# Patient Record
Sex: Female | Born: 1995 | Race: Black or African American | Hispanic: No | Marital: Single | Smoking: Never smoker
Health system: Southern US, Community
[De-identification: ages and names within clinical notes are randomized; demographics above are authoritative.]

## PROBLEM LIST (undated history)

## (undated) DIAGNOSIS — J45909 Unspecified asthma, uncomplicated: Secondary | ICD-10-CM

---
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
## 2015-05-05 ENCOUNTER — Encounter (HOSPITAL_COMMUNITY): Payer: Self-pay | Admitting: Emergency Medicine

## 2015-05-05 ENCOUNTER — Emergency Department (INDEPENDENT_AMBULATORY_CARE_PROVIDER_SITE_OTHER)
Admission: EM | Admit: 2015-05-05 | Discharge: 2015-05-05 | Disposition: A | Discharge status: Home / Self Care | Payer: PRIVATE HEALTH INSURANCE | Source: Home / Self Care | Attending: Family Medicine | Admitting: Family Medicine

## 2015-05-05 DIAGNOSIS — K5901 Slow transit constipation: Secondary | ICD-10-CM

## 2015-05-05 DIAGNOSIS — N939 Abnormal uterine and vaginal bleeding, unspecified: Secondary | ICD-10-CM | POA: Diagnosis not present

## 2015-05-05 LAB — POCT I-STAT, CHEM 8
BUN: 12 mg/dL (ref 6–20)
CHLORIDE: 108 mmol/L (ref 101–111)
CREATININE: 0.7 mg/dL (ref 0.44–1.00)
Calcium, Ion: 1.25 mmol/L — ABNORMAL HIGH (ref 1.12–1.23)
Glucose, Bld: 81 mg/dL (ref 65–99)
HCT: 38 % (ref 36.0–46.0)
Hemoglobin: 12.9 g/dL (ref 12.0–15.0)
Potassium: 4.1 mmol/L (ref 3.5–5.1)
Sodium: 141 mmol/L (ref 135–145)
TCO2: 23 mmol/L (ref 0–100)

## 2015-05-05 LAB — POCT PREGNANCY, URINE: Preg Test, Ur: NEGATIVE

## 2015-05-05 MED ORDER — MEDROXYPROGESTERONE ACETATE 5 MG PO TABS: ORAL_TABLET | ORAL | Status: AC

## 2015-05-05 NOTE — ED Notes (Signed)
Reports 4 weeks of irregular periods.  Reports sequence may be 5 days on, 2 days off, 5 days on.  Patient reveals a history of menstrual cycle issues.  Reports having a specialist in [place] that is working with her on this issue and birth control options

## 2015-05-05 NOTE — Discharge Instructions (Signed)
Abnormal Uterine Bleeding Abnormal uterine bleeding can affect women at various stages in life, including teenagers, women in their reproductive years, pregnant women, and women who have reached menopause. Several kinds of uterine bleeding are considered abnormal, including:  Bleeding or spotting between periods.   Bleeding after sexual intercourse.   Bleeding that is heavier or more than normal.   Periods that last longer than usual.  Bleeding after menopause.  Many cases of abnormal uterine bleeding are minor and simple to treat, while others are more serious. Any type of abnormal bleeding should be evaluated by your health care provider. Treatment will depend on the cause of the bleeding. HOME CARE INSTRUCTIONS Monitor your condition for any changes. The following actions may help to alleviate any discomfort you are experiencing:  Avoid the use of tampons and douches as directed by your health care provider.  Change your pads frequently. You should get regular pelvic exams and Pap tests. Keep all follow-up appointments for diagnostic tests as directed by your health care provider.  SEEK MEDICAL CARE IF:   Your bleeding lasts more than 1 week.   You feel dizzy at times.  SEEK IMMEDIATE MEDICAL CARE IF:   You pass out.   You are changing pads every 15 to 30 minutes.   You have abdominal pain.  You have a fever.   You become sweaty or weak.   You are passing large blood clots from the vagina.   You start to feel nauseous and vomit. MAKE SURE YOU:   Understand these instructions.  Will watch your condition.  Will get help right away if you are not doing well or get worse.   This information is not intended to replace advice given to you by your health care provider. Make sure you discuss any questions you have with your health care provider.   Document Released: 06/21/2005 Document Revised: 06/26/2013 Document Reviewed: 01/18/2013 Elsevier Interactive  Patient Education 2016 [employer].  Constipation, Adult Recommend MiraLAX. Place 1capfull in 6-8 ounces of water or other liquid to have a BM. If needed repeat daily until you have a good BM. Drink plenty of fluids, increase fiber in your diet. Constipation is when a person has fewer than three bowel movements a week, has difficulty having a bowel movement, or has stools that are dry, hard, or larger than normal. As people grow older, constipation is more common. A low-fiber diet, not taking in enough fluids, and taking certain medicines may make constipation worse.  CAUSES   Certain medicines, such as antidepressants, pain medicine, iron supplements, antacids, and water pills.   Certain diseases, such as diabetes, irritable bowel syndrome (IBS), thyroid disease, or depression.   Not drinking enough water.   Not eating enough fiber-rich foods.   Stress or travel.   Lack of physical activity or exercise.   Ignoring the urge to have a bowel movement.   Using laxatives too much.  SIGNS AND SYMPTOMS   Having fewer than three bowel movements a week.   Straining to have a bowel movement.   Having stools that are hard, dry, or larger than normal.   Feeling full or bloated.   Pain in the lower abdomen.   Not feeling relief after having a bowel movement.  DIAGNOSIS  Your health care provider will take a medical history and perform a physical exam. Further testing may be done for severe constipation. Some tests may include:  A barium enema X-ray to examine your rectum, colon, and, sometimes, your  small intestine.   A sigmoidoscopy to examine your lower colon.   A colonoscopy to examine your entire colon. TREATMENT  Treatment will depend on the severity of your constipation and what is causing it. Some dietary treatments include drinking more fluids and eating more fiber-rich foods. Lifestyle treatments may include regular exercise. If these diet and lifestyle  recommendations do not help, your health care provider may recommend taking over-the-counter laxative medicines to help you have bowel movements. Prescription medicines may be prescribed if over-the-counter medicines do not work.  HOME CARE INSTRUCTIONS   Eat foods that have a lot of fiber, such as fruits, vegetables, whole grains, and beans.  Limit foods high in fat and processed sugars, such as french fries, hamburgers, cookies, candies, and soda.   A fiber supplement may be added to your diet if you cannot get enough fiber from foods.   Drink enough fluids to keep your urine clear or pale yellow.   Exercise regularly or as directed by your health care provider.   Go to the restroom when you have the urge to go. Do not hold it.   Only take over-the-counter or prescription medicines as directed by your health care provider. Do not take other medicines for constipation without talking to your health care provider first.  SEEK IMMEDIATE MEDICAL CARE IF:   You have bright red blood in your stool.   Your constipation lasts for more than 4 days or gets worse.   You have abdominal or rectal pain.   You have thin, pencil-like stools.   You have unexplained weight loss. MAKE SURE YOU:   Understand these instructions.  Will watch your condition.  Will get help right away if you are not doing well or get worse.   This information is not intended to replace advice given to you by your health care provider. Make sure you discuss any questions you have with your health care provider.   Document Released: 03/19/2004 Document Revised: 07/12/2014 Document Reviewed: 04/02/2013 Elsevier Interactive Patient Education [employer] Elsevier Inc.

## 2015-05-05 NOTE — ED Provider Notes (Signed)
CSN: [id number]     Arrival date & time 05/05/15  1344 History   First MD Initiated Contact with Patient 05/05/15 1529     Chief Complaint  Patient presents with  . Vaginal Bleeding   (Consider location/radiation/quality/duration/timing/severity/associated sxs/prior Treatment) HPI Comments: 19 year old female complaining of irregular menstrual flow for proximally 4 weeks. She states that she will have a flow for 2-4 days at a time and then stopped for 2-3 days before it restarts. She states she uses approximately one and a half pads per hour. She has been having menstrual irregularities for several months and has a physician in [place] who is treating her for this problem. She has been treated with OCPs and hormonal patches. She has been cycling these over a period of time. She finally removed the patch approximately 2 weeks ago and she is currently not taking any type of hormonal birth control. She decided to call her today and she had no appointments today so she was sent to the urgent care. She states she is feeling a little lightheaded and having some abdominal cramping. She states the cramping is in the lower abdomen.  She points to the mid abdomen as the source of cramping. She does not have regular bowel movements. Her last normal BM was around 4 days ago.   Past Medical History  Diagnosis Date  . Asthma    History reviewed. No pertinent past surgical history. Family History  Problem Relation Age of Onset  . Kidney failure Father   . Heart attack Other   . Heart attack Maternal Grandmother   . Heart attack Maternal Aunt    Social History  Substance Use Topics  . Smoking status: Never Smoker   . Smokeless tobacco: None  . Alcohol Use: No   OB History    No data available     Review of Systems  Constitutional: Positive for activity change. Negative for fever.  HENT: Negative.   Respiratory: Negative.   Cardiovascular: Positive for palpitations. Negative for chest pain.   Genitourinary: Positive for menstrual problem. Negative for dysuria, frequency, difficulty urinating and pelvic pain.  Musculoskeletal: Negative.   Skin: Negative.   Neurological: Positive for light-headedness. Negative for tremors, syncope, facial asymmetry, speech difficulty, numbness and headaches.    Allergies  Review of patient's allergies indicates no known allergies.  Home Medications   Prior to Admission medications   Medication Sig Start Date End Date Taking? Authorizing Provider  ALBUTEROL IN Inhale into the lungs.    Historical Provider, MD  amoxicillin-clavulanate (AUGMENTIN) 875-125 MG per tablet Take 1 tablet by mouth 2 (two) times daily. 08/11/14   Ozella Rocksavid J Merrell, MD  fluconazole (DIFLUCAN) 150 MG tablet Take 1 tablet (150 mg total) by mouth daily. Repeat dose in 3 days 08/11/14   Ozella Rocksavid J Merrell, MD  ipratropium (ATROVENT) 0.06 % nasal spray Place 2 sprays into both nostrils 4 (four) times daily. 08/11/14   Ozella Rocksavid J Merrell, MD  medroxyPROGESTERone (PROVERA) 5 MG tablet Take 2 tablets daily for 7 d. 05/05/15   Hayden Rasmussenavid Ramiz Turpin, NP  Pseudoeph-Doxylamine-DM-APAP (NYQUIL PO) Take by mouth.    Historical Provider, MD   Meds Ordered and Administered this Visit  Medications - No data to display  BP 133/88 mmHg  Pulse 62  Temp(Src) 98.3 F (36.8 C) (Oral)  Resp 18  SpO2 99% No data found.   Physical Exam  Constitutional: She is oriented to person, place, and time. She appears well-developed and well-nourished. No distress.  Eyes:  EOM are normal.  Neck: Normal range of motion. Neck supple.  Cardiovascular: Normal rate, regular rhythm and normal heart sounds.   Pulmonary/Chest: Effort normal and breath sounds normal. No respiratory distress.  Abdominal: Soft. Bowel sounds are normal. She exhibits no distension and no mass. There is no rebound and no guarding.  Mild tenderness in the mid abdomen. Palpation of the epigastrium produces cramps in the lower abdomen. No focal  tenderness.   Musculoskeletal: She exhibits no edema.  Neurological: She is alert and oriented to person, place, and time. She exhibits normal muscle tone.  Skin: Skin is warm and dry.  Psychiatric: She has a normal mood and affect.  Nursing note and vitals reviewed.   ED Course  Procedures (including critical care time)  Labs Review Labs Reviewed  POCT I-STAT, CHEM 8 - Abnormal; Notable for the following:    Calcium, Ion 1.25 (*)    All other components within normal limits  POCT PREGNANCY, URINE   Results for orders placed or performed during the hospital encounter of 05/05/15  Pregnancy, urine POC  Result Value Ref Range   Preg Test, Ur NEGATIVE NEGATIVE  I-STAT, chem 8  Result Value Ref Range   Sodium 141 135 - 145 mmol/L   Potassium 4.1 3.5 - 5.1 mmol/L   Chloride 108 101 - 111 mmol/L   BUN 12 6 - 20 mg/dL   Creatinine, Ser 0.98 0.44 - 1.00 mg/dL   Glucose, Bld 81 65 - 99 mg/dL   Calcium, Ion 1.19 (H) 1.12 - 1.23 mmol/L   TCO2 23 0 - 100 mmol/L   Hemoglobin 12.9 12.0 - 15.0 g/dL   HCT 14.7 82.9 - 56.2 %    Imaging Review No results found.   Visual Acuity Review  Right Eye Distance:   Left Eye Distance:   Bilateral Distance:    Right Eye Near:   Left Eye Near:    Bilateral Near:         MDM   1. Abnormal uterine bleeding (AUB)   2. Slow transit constipation    Provera 10 mg a day x 7 d. Call your doctor today for appintment for follow up. For problems prior to seeing your doctor go to the [facility]. Recommend MiraLAX. Place 1capfull in 6-8 ounces of water or other liquid to have a BM. If needed repeat daily until you have a good BM. Drink plenty of fluids, increase fiber in your diet.     Hayden Rasmussen, NP 05/05/15 [phone]

## 2015-07-29 ENCOUNTER — Emergency Department (INDEPENDENT_AMBULATORY_CARE_PROVIDER_SITE_OTHER): Payer: PRIVATE HEALTH INSURANCE

## 2015-07-29 ENCOUNTER — Emergency Department (HOSPITAL_COMMUNITY)
Admission: EM | Admit: 2015-07-29 | Discharge: 2015-07-29 | Disposition: A | Discharge status: Home / Self Care | Payer: PRIVATE HEALTH INSURANCE | Source: Home / Self Care | Attending: Emergency Medicine | Admitting: Emergency Medicine

## 2015-07-29 ENCOUNTER — Encounter (HOSPITAL_COMMUNITY): Payer: Self-pay | Admitting: *Deleted

## 2015-07-29 DIAGNOSIS — R6889 Other general symptoms and signs: Secondary | ICD-10-CM

## 2015-07-29 MED ORDER — HYDROCODONE-HOMATROPINE 5-1.5 MG/5ML PO SYRP: 5.0000 mL | ORAL_SOLUTION | Freq: Four times a day (QID) | ORAL | Status: AC | PRN

## 2015-07-29 MED ORDER — SODIUM CHLORIDE 0.9 % IV BOLUS (SEPSIS)
1000.0000 mL | Freq: Once | INTRAVENOUS | Status: AC
  Administered 2015-07-29: 1000 mL via INTRAVENOUS

## 2015-07-29 MED ORDER — CETIRIZINE HCL 10 MG PO TABS: 10.0000 mg | ORAL_TABLET | Freq: Every day | ORAL | Status: AC

## 2015-07-29 MED ORDER — IBUPROFEN 600 MG PO TABS: 600.0000 mg | ORAL_TABLET | Freq: Four times a day (QID) | ORAL | Status: DC | PRN

## 2015-07-29 NOTE — ED Notes (Signed)
Pt  reports  Symptoms  Of  Body  Aches    Nasal  Congestion     Pt  Reports    Cough   As  Well  X  2-3  Days       She  Reports  A  History  Of  Asthma      Symptoms  Not  releived  By  mucinex

## 2015-07-29 NOTE — Discharge Instructions (Signed)
You have the flu. Get plenty of rest and drink plenty of fluids. Takes cetirizine daily to help with the congestion and drainage. Take ibuprofen every 6 hours as needed to help with body aches. Use the Hycodan every 4-6 hours as needed for cough. Do not drive while taking this medicine. There were should be over in the next few days, but you will likely take another week to fully recover. Follow-up as needed.

## 2015-07-29 NOTE — ED Provider Notes (Signed)
CSN: [id number]     Arrival date & time 07/29/15  1304 History   First MD Initiated Contact with Patient 07/29/15 1408     Chief Complaint  Patient presents with  . URI   (Consider location/radiation/quality/duration/timing/severity/associated sxs/prior Treatment) HPI  She is a 20 year old woman here for evaluation of cough. She states for the last 3-4 days she has had body aches, nasal congestion, rhinorrhea, sore throat, and cough. Cough is mostly nonproductive. She reports subjective fevers, but has not taken her temperature. She reports some nausea. She did have one episode of emesis on Saturday. She is able to tolerate by mouth well at this time.  She also reports feeling very tired and fatigued. She describes feeling short of breath if she lays for too long or if she walks. No known sick contacts. She has tried Mucinex without improvement.  Past Medical History  Diagnosis Date  . Asthma    History reviewed. No pertinent past surgical history. Family History  Problem Relation Age of Onset  . Kidney failure Father   . Heart attack Other   . Heart attack Maternal Grandmother   . Heart attack Maternal Aunt    Social History  Substance Use Topics  . Smoking status: Never Smoker   . Smokeless tobacco: None  . Alcohol Use: No   OB History    No data available     Review of Systems As in history of present illness Allergies  Review of patient's allergies indicates no known allergies.  Home Medications   Prior to Admission medications   Medication Sig Start Date End Date Taking? Authorizing Provider  ALBUTEROL IN Inhale into the lungs.    Historical Provider, MD  cetirizine (ZYRTEC) 10 MG tablet Take 1 tablet (10 mg total) by mouth daily. 07/29/15   Charm Rings, MD  HYDROcodone-homatropine (HYCODAN) 5-1.5 MG/5ML syrup Take 5 mLs by mouth every 6 (six) hours as needed for cough. 07/29/15   Charm Rings, MD  ibuprofen (ADVIL,MOTRIN) 600 MG tablet Take 1 tablet (600 mg total) by  mouth every 6 (six) hours as needed for moderate pain. 07/29/15   Charm Rings, MD  ipratropium (ATROVENT) 0.06 % nasal spray Place 2 sprays into both nostrils 4 (four) times daily. 08/11/14   Ozella Rocks, MD  medroxyPROGESTERone (PROVERA) 5 MG tablet Take 2 tablets daily for 7 d. 05/05/15   Hayden Rasmussen, NP  Pseudoeph-Doxylamine-DM-APAP (NYQUIL PO) Take by mouth.    Historical Provider, MD   Meds Ordered and Administered this Visit   Medications  sodium chloride 0.9 % bolus 1,000 mL (1,000 mLs Intravenous Given 07/29/15 1453)    BP 134/89 mmHg  Pulse 105  Temp(Src) 99.4 F (37.4 C) (Oral)  Resp 16  SpO2 98%  LMP 07/27/2015 No data found.   Physical Exam  Constitutional: She is oriented to person, place, and time. She appears well-developed and well-nourished. No distress.  Laying curled up on exam table  HENT:  Mouth/Throat: Oropharynx is clear and moist. No oropharyngeal exudate.  Some clear nasal discharge present. Nasal mucosa normal.  Neck: Neck supple.  Cardiovascular: Regular rhythm, normal heart sounds and intact distal pulses.   No murmur heard. Mild tachycardia  Pulmonary/Chest: Effort normal and breath sounds normal. No respiratory distress. She has no wheezes. She has no rales.  Lymphadenopathy:    She has no cervical adenopathy.  Neurological: She is alert and oriented to person, place, and time.    ED Course  Procedures (including  critical care time)  Labs Review Labs Reviewed - No data to display  Imaging Review Dg Chest 2 View  07/29/2015  CLINICAL DATA:  Asthma, cough, stuffy nose, chest pain last night EXAM: CHEST  2 VIEW COMPARISON:  08/11/2014 FINDINGS: The heart size and mediastinal contours are within normal limits. Both lungs are clear. The visualized skeletal structures are unremarkable. IMPRESSION: No active cardiopulmonary disease. Electronically Signed   By: Natasha Mead M.D.   On: 07/29/2015 14:40     MDM   1. Flu-like symptoms    Patient  became lightheaded during x-ray. We'll give her 1 L normal saline bolus.  She is feeling better after the bolus. X-ray negative. Symptoms consistent with flu. Discussed symptomatic treatment with rest and fluids. Cetirizine for congestion and drainage, Hycodan for cough, ibuprofen for body aches. Follow-up as needed.    Charm Rings, MD 07/29/15 1538

## 2017-04-16 ENCOUNTER — Encounter (HOSPITAL_COMMUNITY): Payer: Self-pay | Admitting: Emergency Medicine

## 2017-04-16 ENCOUNTER — Ambulatory Visit (HOSPITAL_COMMUNITY)
Admission: EM | Admit: 2017-04-16 | Discharge: 2017-04-16 | Disposition: A | Discharge status: Home / Self Care | Payer: PRIVATE HEALTH INSURANCE | Attending: Internal Medicine | Admitting: Internal Medicine

## 2017-04-16 ENCOUNTER — Ambulatory Visit (INDEPENDENT_AMBULATORY_CARE_PROVIDER_SITE_OTHER): Payer: PRIVATE HEALTH INSURANCE

## 2017-04-16 DIAGNOSIS — S93402A Sprain of unspecified ligament of left ankle, initial encounter: Secondary | ICD-10-CM | POA: Diagnosis not present

## 2017-04-16 MED ORDER — NAPROXEN 500 MG PO TABS: 500.0000 mg | ORAL_TABLET | Freq: Two times a day (BID) | ORAL | 0 refills | Status: AC

## 2017-04-16 NOTE — ED Triage Notes (Signed)
Pt reports she inj her left foot last night while going down some stairs  Sx include: swelling, pain... Unable to bear wt  Brought back on wheel chair.

## 2017-04-16 NOTE — ED Provider Notes (Signed)
[facility]    CSN: [id number] Arrival date & time: 04/16/17  1549     History   Chief Complaint Chief Complaint  Patient presents with  . Foot Injury    HPI Kendra Owen is a 21 y.o. female. She presents today after missing a step last evening, fell down some steps and twisted her left ankle. She is not aware of a prior injury to the left ankle, but says that she is clumsy. Last night she fell from about the fifth step from the bottom. No other injury reported.    HPI  Past Medical History:  Diagnosis Date  . Asthma    History reviewed. No pertinent surgical history.   Home Medications    Prior to Admission medications   Medication Sig Start Date End Date Taking? Authorizing Provider  ALBUTEROL IN Inhale into the lungs.    [provider]  cetirizine (ZYRTEC) 10 MG tablet Take 1 tablet (10 mg total) by mouth daily. 07/29/15   Charm Rings, MD  HYDROcodone-homatropine [facility]) 5-1.5 MG/5ML syrup Take 5 mLs by mouth every 6 (six) hours as needed for cough. 07/29/15   Charm Rings, MD  ipratropium (ATROVENT) 0.06 % nasal spray Place 2 sprays into both nostrils 4 (four) times daily. 08/11/14   Ozella Rocks, MD  medroxyPROGESTERone (PROVERA) 5 MG tablet Take 2 tablets daily for 7 d. 05/05/15   Hayden Rasmussen, NP  naproxen (NAPROSYN) 500 MG tablet Take 1 tablet (500 mg total) by mouth 2 (two) times daily. 04/16/17   Eustace Moore, MD  Pseudoeph-Doxylamine-DM-APAP (NYQUIL PO) Take by mouth.    [provider]    Family History Family History  Problem Relation Age of Onset  . Kidney failure Father   . Heart attack Other   . Heart attack Maternal Grandmother   . Heart attack Maternal Aunt     Social History Social History  Substance Use Topics  . Smoking status: Never Smoker  . Smokeless tobacco: Never Used  . Alcohol use No     Allergies   Patient has no known allergies.   Review of Systems Review of Systems  All other systems  reviewed and are negative.    Physical Exam Triage Vital Signs ED Triage Vitals  Enc Vitals Group     BP 04/16/17 1613 128/77     Pulse Rate 04/16/17 1613 96     Resp 04/16/17 1613 20     Temp 04/16/17 1613 99.5 F (37.5 C)     Temp Source 04/16/17 1613 Oral     SpO2 04/16/17 1613 100 %     Weight --      Height --      Pain Score 04/16/17 1614 5     Pain Loc --    Updated Vital Signs BP 128/77 (BP Location: Left Arm)   Pulse 96   Temp 99.5 F (37.5 C) (Oral)   Resp 20   LMP 03/18/2017   SpO2 100%   Physical Exam  Constitutional: She is oriented to person, place, and time. No distress.  HENT:  Head: Atraumatic.  Eyes:  Conjugate gaze observed, no eye redness/discharge  Neck: Neck supple.  Cardiovascular: Normal rate.   Pulmonary/Chest: No respiratory distress.  Abdominal: She exhibits no distension.  Musculoskeletal: Normal range of motion.  Lateral left ankle and dorsal foot are slightly swollen compared to the right. She is able to move her toes and move her ankle although this is painful.  Diffusely tender to palpation, but most painful at the inferior aspect of the lateral malleolus. Foot is warm.  Neurological: She is alert and oriented to person, place, and time.  Skin: Skin is warm and dry.  Nursing note and vitals reviewed.    UC Treatments / Results   Radiology Dg Ankle Complete Left  Result Date: 04/16/2017 CLINICAL DATA:  21 year old female status post injury while going down stairs lateral malleolus pain. Unable to bear weight. EXAM: LEFT ANKLE COMPLETE - 3+ VIEW COMPARISON:  None. FINDINGS: Evidence of ankle joint effusion on the lateral view. Mortise joint alignment appears preserved with intact haler dome. No fracture of the distal tibia or fibula IDA. The calcaneus appears intact. There is a corticated ossific fragment at the dorsal talonavicular articulation. Elsewhere the visible left foot appears intact. IMPRESSION: 1. Suspected left ankle joint  effusion but no acute fracture or dislocation identified. 2. Small chronic appearing fragment at the dorsal talonavicular joint. Electronically Signed   By: Odessa Fleming M.D.   On: 04/16/2017 16:44    Procedures Procedures (including critical care time) None today  Final Clinical Impressions(s) / UC Diagnoses   Final diagnoses:  Moderate left ankle sprain, initial encounter   Anticipate gradual improvement in pain and swelling over the next several days. X-ray today did not show any new bony injury/fracture. There is evidence of an old bony injury, but probably appeared like a sprain or just a bump/bruise when it happened. Wear boot for comfort. Weight bear as tolerated. Ice and elevate the ankle for 5-10 minutes several times daily as needed to help with swelling and pain. Prescription for Naprosyn given, to help with pain also. Recheck or follow up with your primary care provider or sports med provider if not improving as expected.  New Prescriptions New Prescriptions   NAPROXEN (NAPROSYN) 500 MG TABLET    Take 1 tablet (500 mg total) by mouth 2 (two) times daily.     Controlled Substance Prescriptions [place] Controlled Substance Registry consulted? No   Eustace Moore, MD 04/17/17 [phone]

## 2017-04-16 NOTE — Discharge Instructions (Addendum)
Anticipate gradual improvement in pain and swelling over the next several days. X-ray today did not show any new bony injury/fracture. There is evidence of an old bony injury, but probably appeared like a sprain or just a bump/bruise when it happened. Wear boot for comfort. Weight bear as tolerated. Ice and elevate the ankle for 5-10 minutes several times daily as needed to help with swelling and pain. Prescription for Naprosyn given, to help with pain also. Recheck or follow up with your primary care provider or sports med provider if not improving as expected.

## 2017-09-02 IMAGING — DX DG CHEST 2V
2 series · 2 of 2 positions shown · non-contrast
Comparison: 08/11/2014

CLINICAL DATA: Asthma, cough, stuffy nose, chest pain last night

EXAM:
CHEST  2 VIEW

[chest pa]
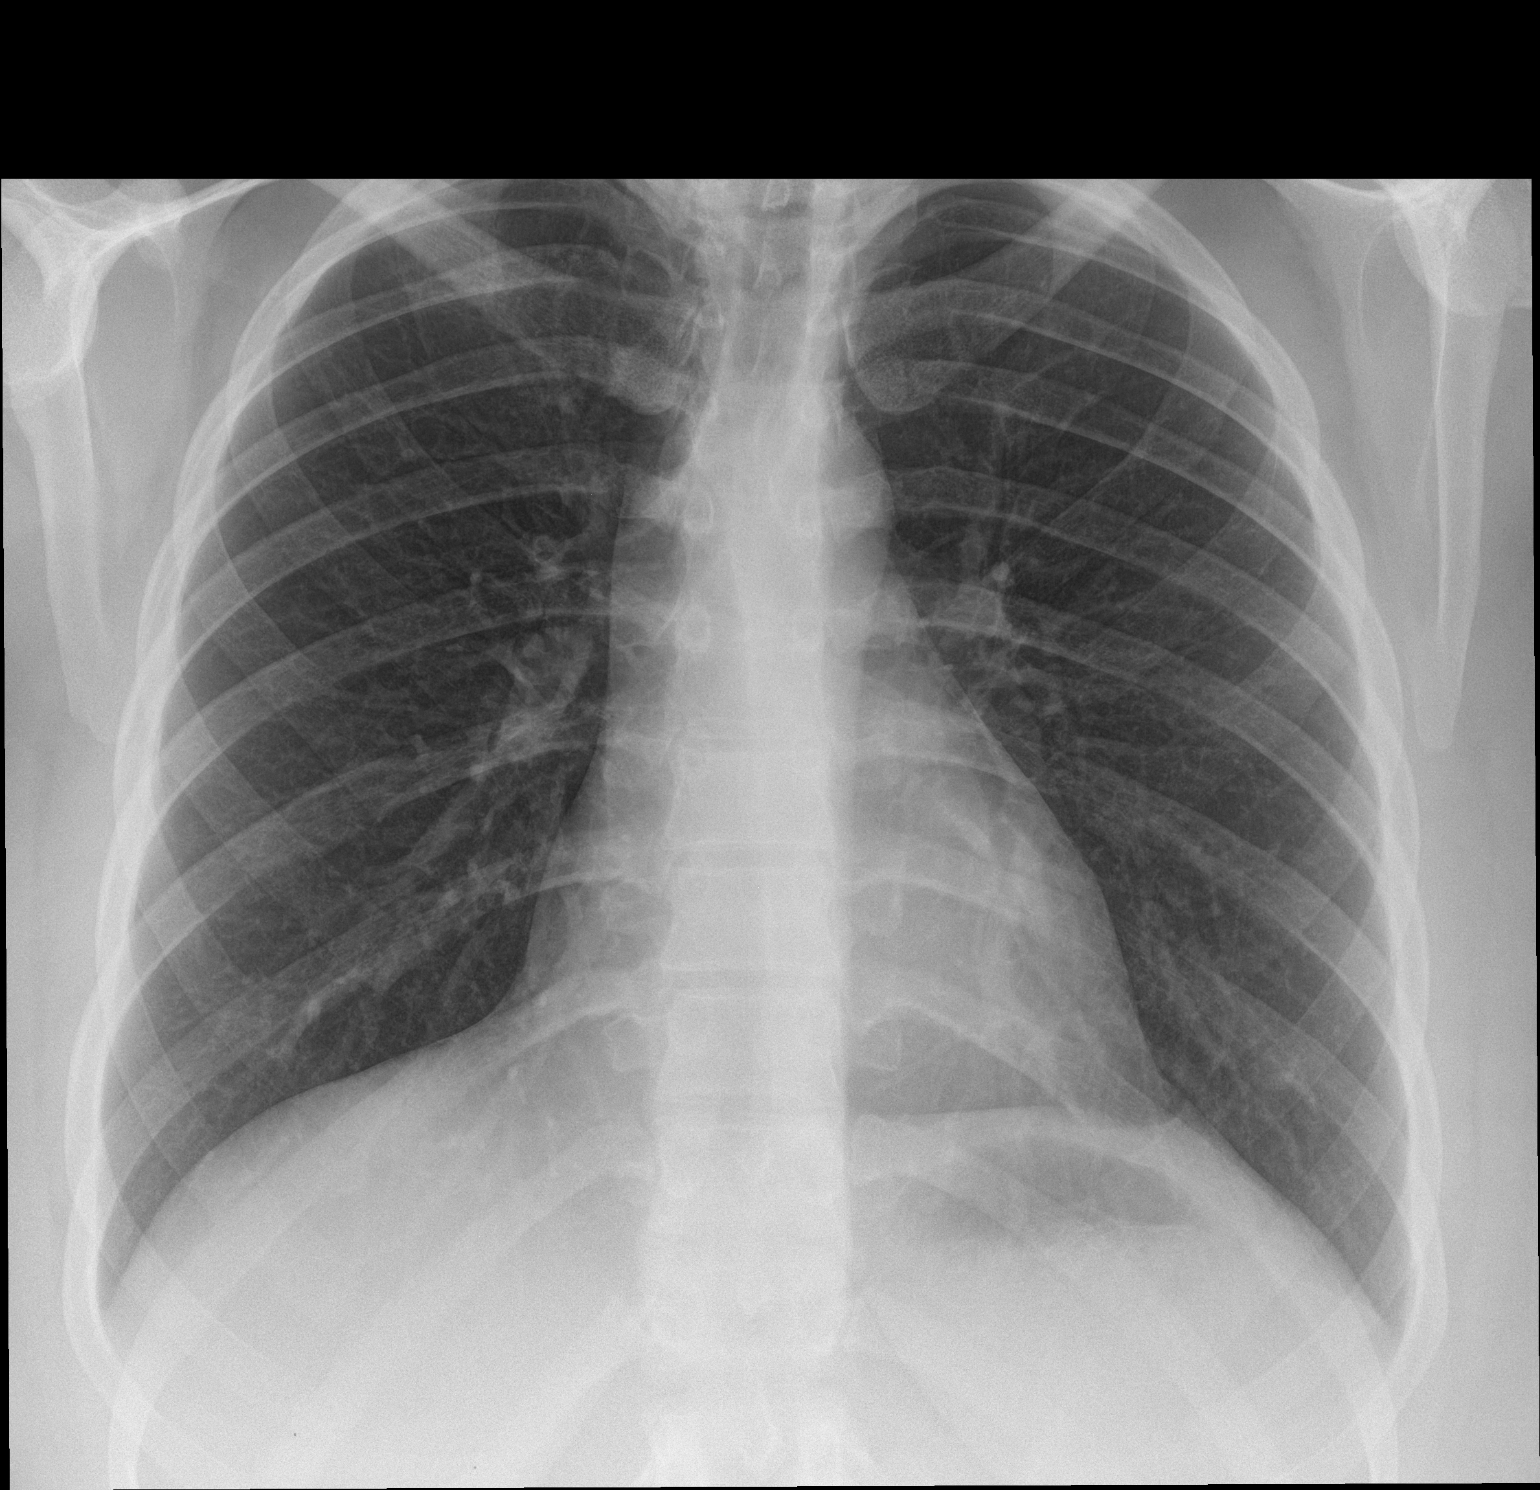

[chest lat]
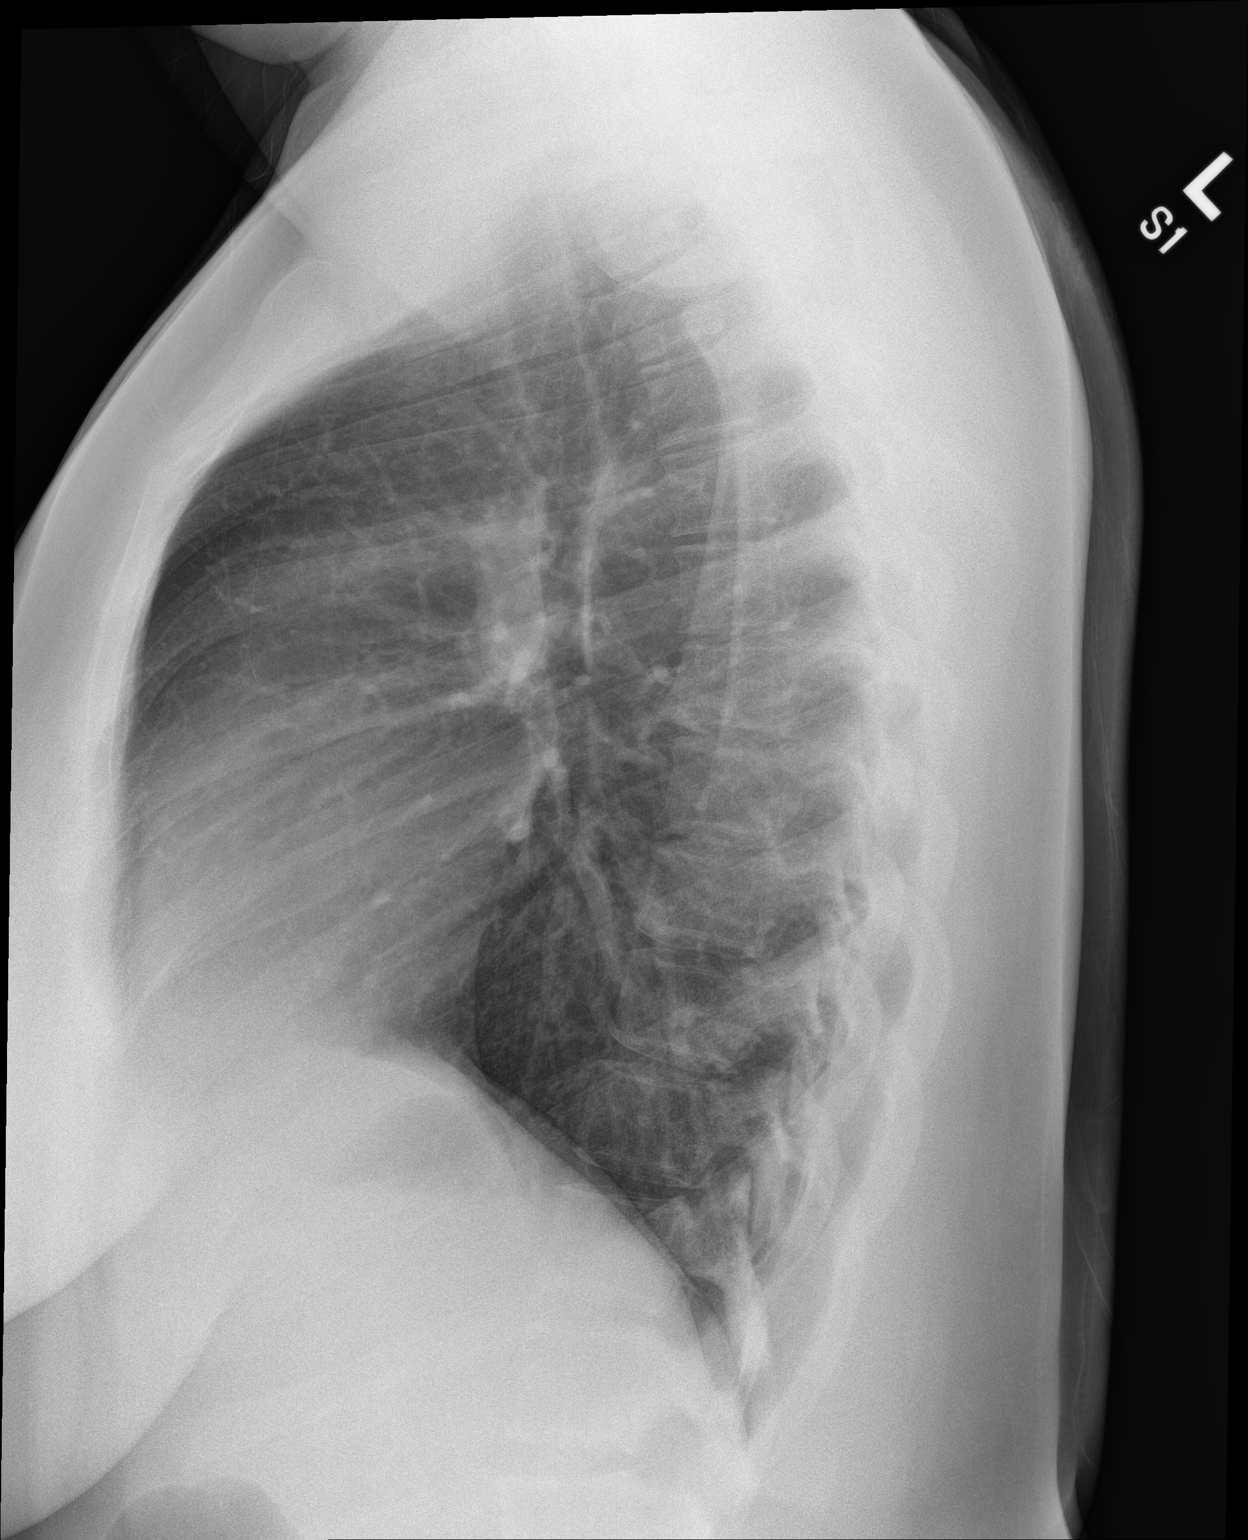

[2 of 2 positions shown; findings below may reference images not displayed]

FINDINGS: The heart size and mediastinal contours are within normal limits.
Both lungs are clear. The visualized skeletal structures are
unremarkable.
IMPRESSION: No active cardiopulmonary disease.

## 2019-05-22 IMAGING — DX DG ANKLE COMPLETE 3+V*L*
3 series · 3 of 3 positions shown · non-contrast
Comparison: None.

CLINICAL DATA: 21-year-old female status post injury while going
down stairs lateral malleolus pain. Unable to bear weight.

EXAM:
LEFT ANKLE COMPLETE - 3+ VIEW

[ankle ap]
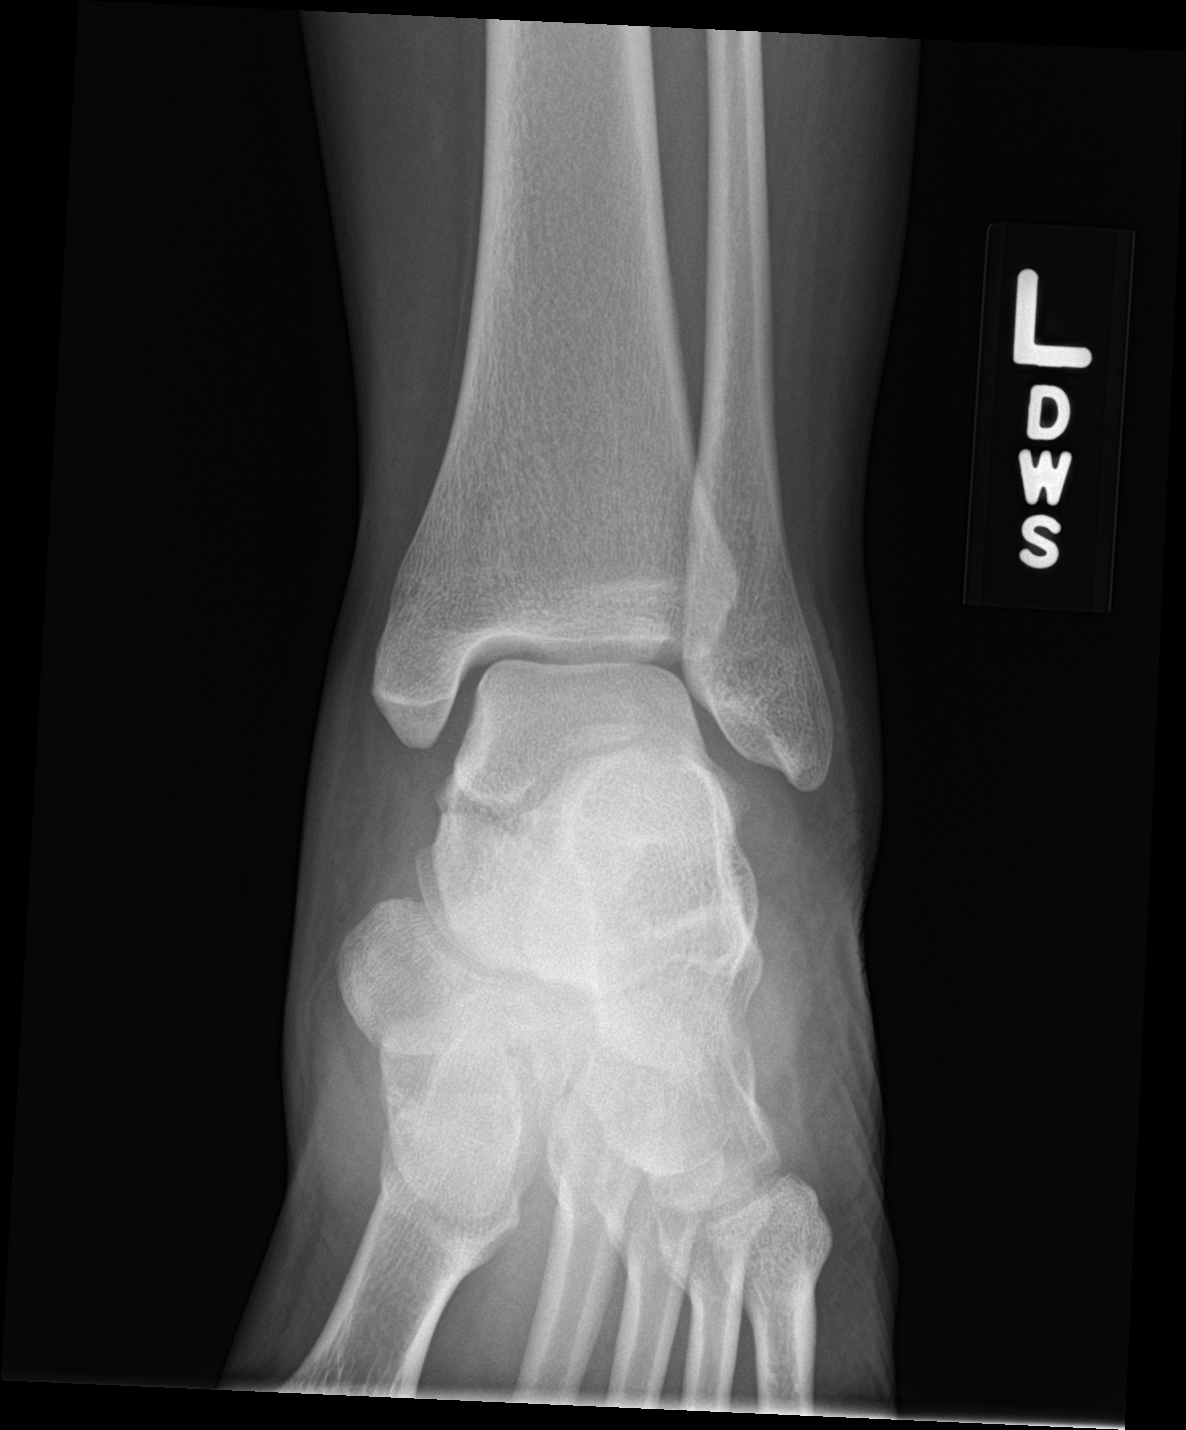

[ankle obl]
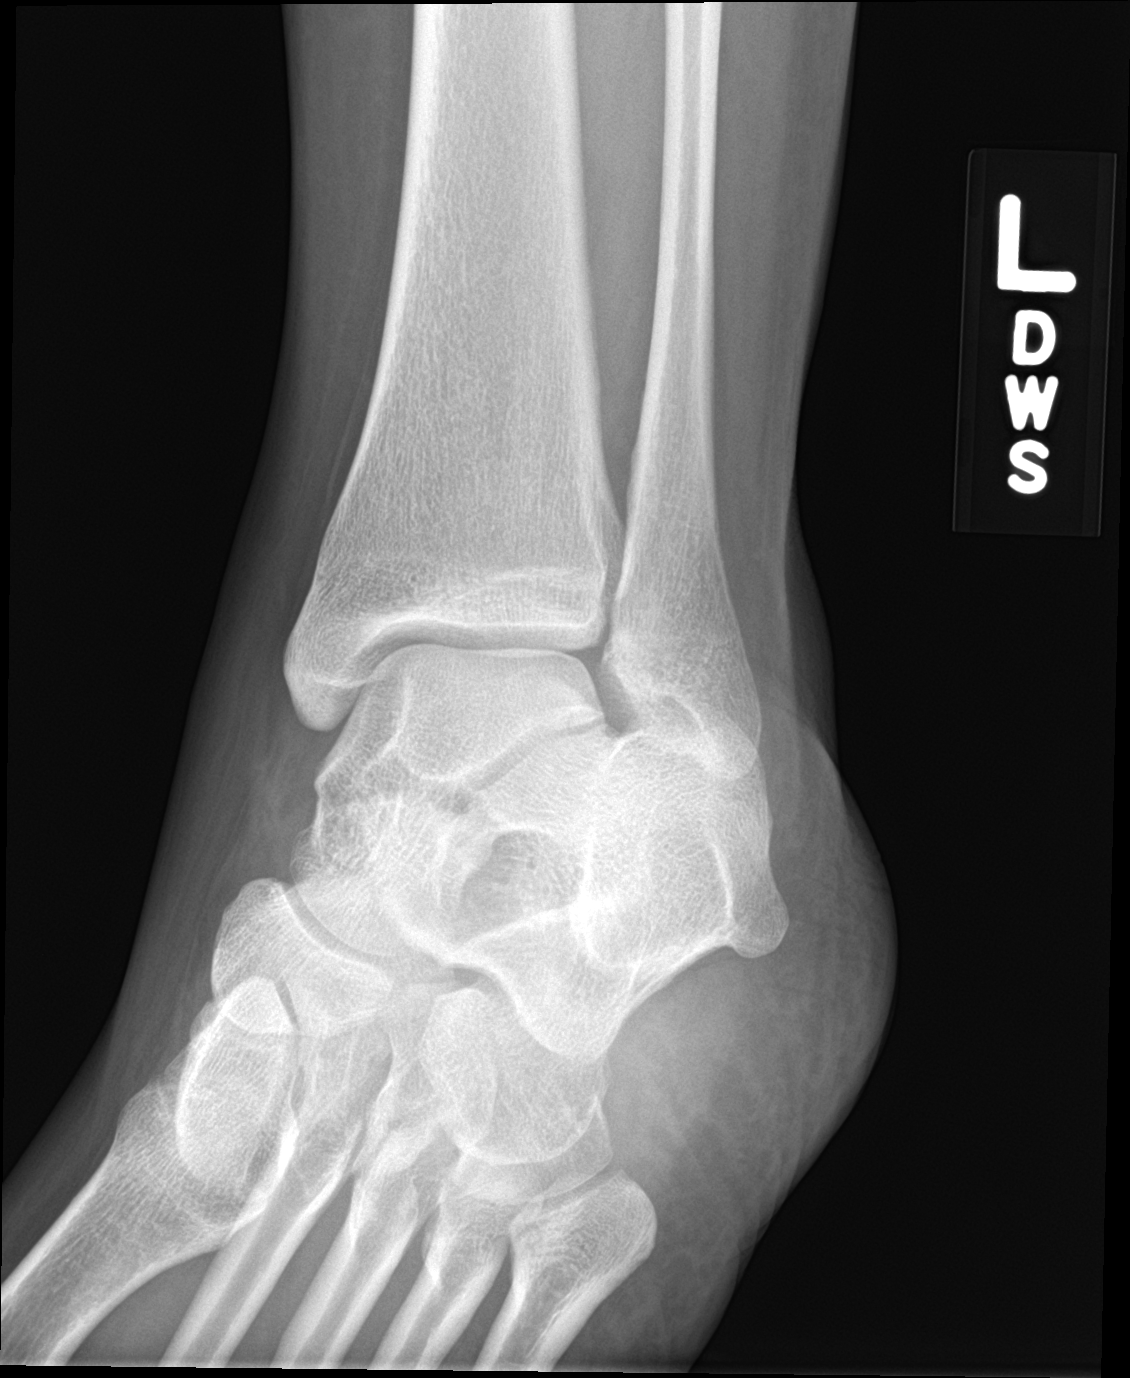

[ankle lat]
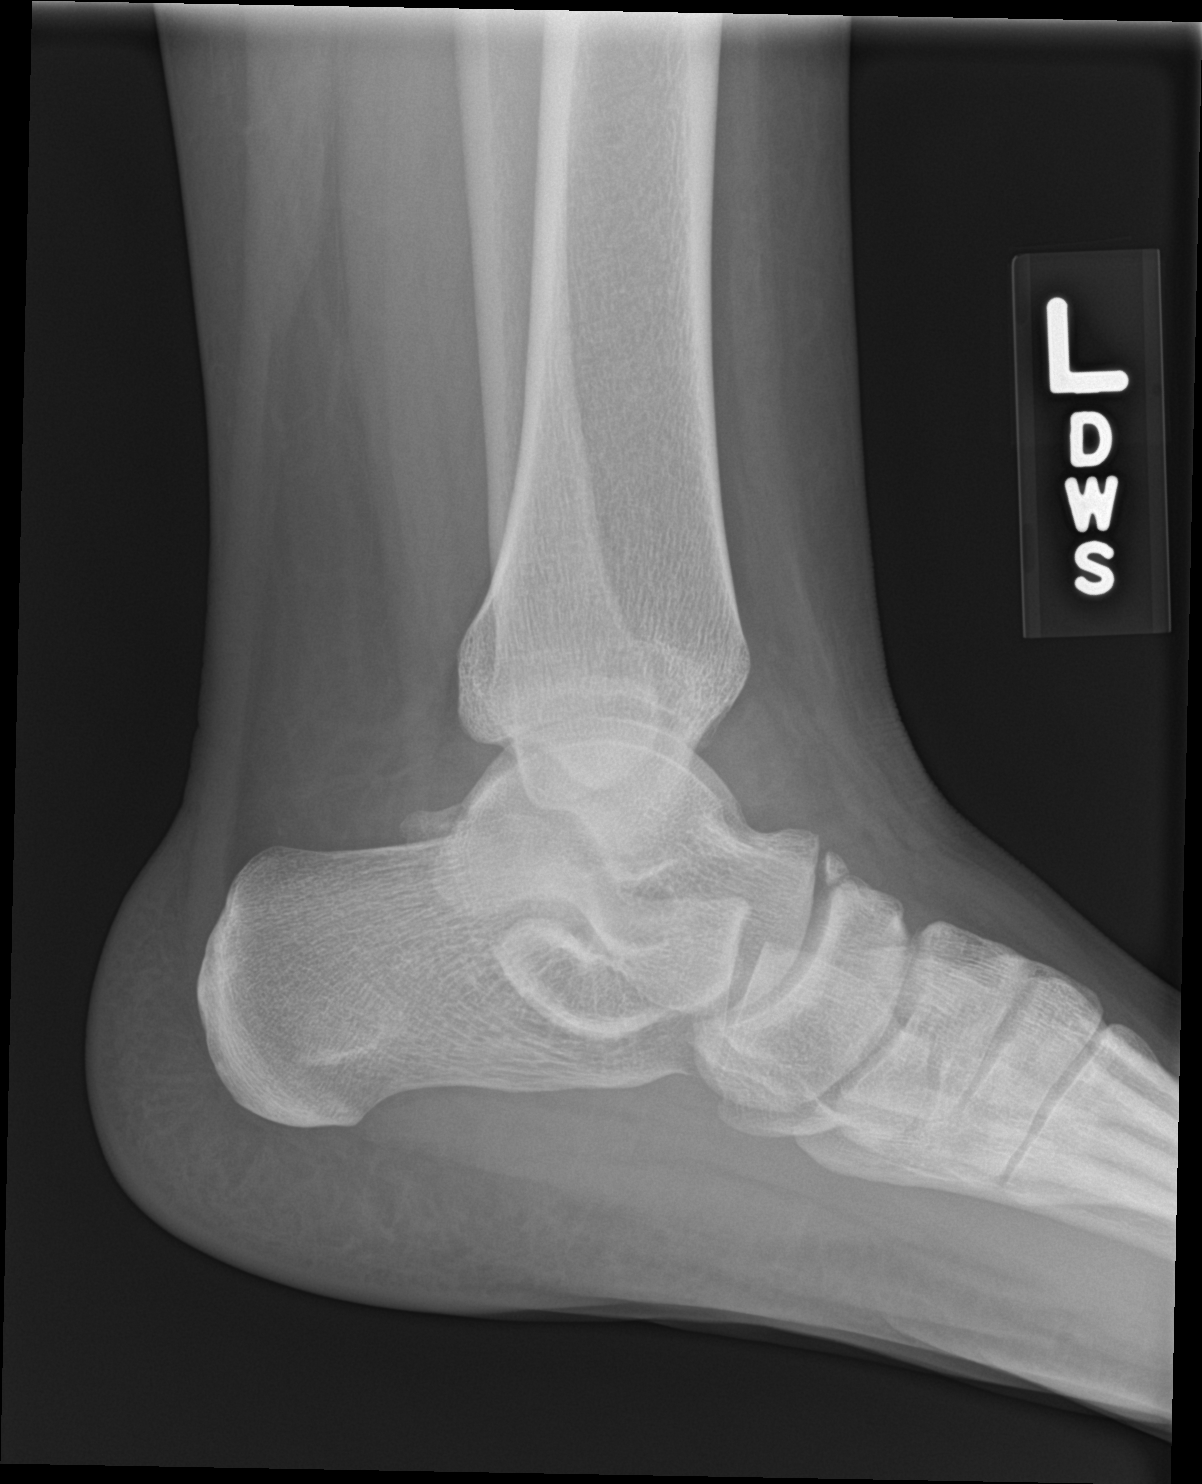

[3 of 3 positions shown; findings below may reference images not displayed]

FINDINGS: Evidence of ankle joint effusion on the lateral view. Mortise joint
alignment appears preserved with intact haler dome. No fracture of
the distal tibia or fibula [id number]. The calcaneus appears intact. There
is a corticated ossific fragment at the dorsal talonavicular
articulation. Elsewhere the visible left foot appears intact.
IMPRESSION: 1. Suspected left ankle joint effusion but no acute fracture or
dislocation identified.
2. Small chronic appearing fragment at the dorsal talonavicular
joint.
